# Patient Record
Sex: Male | Born: 2000 | Race: Black or African American | Hispanic: No | Marital: Single | Smoking: Never smoker
Health system: Southern US, Community
[De-identification: ages and names within clinical notes are randomized; demographics above are authoritative.]

## PROBLEM LIST (undated history)

## (undated) DIAGNOSIS — F419 Anxiety disorder, unspecified: Secondary | ICD-10-CM

## (undated) DIAGNOSIS — F319 Bipolar disorder, unspecified: Secondary | ICD-10-CM

## (undated) DIAGNOSIS — F329 Major depressive disorder, single episode, unspecified: Secondary | ICD-10-CM

## (undated) DIAGNOSIS — R45851 Suicidal ideations: Secondary | ICD-10-CM

## (undated) DIAGNOSIS — F32A Depression, unspecified: Secondary | ICD-10-CM

---
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
## 2022-07-04 ENCOUNTER — Telehealth: Payer: No Typology Code available for payment source | Admitting: Physician Assistant

## 2022-07-04 DIAGNOSIS — K047 Periapical abscess without sinus: Secondary | ICD-10-CM | POA: Diagnosis not present

## 2022-07-04 MED ORDER — AMOXICILLIN 500 MG PO CAPS: 500.0000 mg | ORAL_CAPSULE | Freq: Three times a day (TID) | ORAL | 0 refills | Status: AC

## 2022-07-04 MED ORDER — IBUPROFEN 600 MG PO TABS: 600.0000 mg | ORAL_TABLET | Freq: Three times a day (TID) | ORAL | 0 refills | Status: AC | PRN

## 2022-07-04 NOTE — Progress Notes (Signed)
Virtual Visit Consent   Andres Wood, you are scheduled for a virtual visit with a [facility] provider today. Just as with appointments in the office, your consent must be obtained to participate. Your consent will be active for this visit and any virtual visit you may have with one of our providers in the next 365 days. If you have a MyChart account, a copy of this consent can be sent to you electronically.  As this is a virtual visit, video technology does not allow for your provider to perform a traditional examination. This may limit your provider's ability to fully assess your condition. If your provider identifies any concerns that need to be evaluated in person or the need to arrange testing (such as labs, EKG, etc.), we will make arrangements to do so. Although advances in technology are sophisticated, we cannot ensure that it will always work on either your end or our end. If the connection with a video visit is poor, the visit may have to be switched to a telephone visit. With either a video or telephone visit, we are not always able to ensure that we have a secure connection.  By engaging in this virtual visit, you consent to the provision of healthcare and authorize for your insurance to be billed (if applicable) for the services provided during this visit. Depending on your insurance coverage, you may receive a charge related to this service.  I need to obtain your verbal consent now. Are you willing to proceed with your visit today? Andres Wood has provided verbal consent on 07/04/2022 for a virtual visit (video or telephone). Mar Daring, PA-C  Date: 07/04/2022 9:21 AM  Virtual Visit via Video Note   I, Mar Daring, connected with  Andres Wood  ([id number], Feb 01, 2001) on 07/04/22 at  9:15 AM EDT by a video-enabled telemedicine application and verified that I am speaking with the correct person using two identifiers.  Location: Patient: Virtual Visit Location Patient:  Home Provider: Virtual Visit Location Provider: Home Office   I discussed the limitations of evaluation and management by telemedicine and the availability of in person appointments. The patient expressed understanding and agreed to proceed.    History of Present Illness: Andres Wood is a 21 y.o. who identifies as a male who was assigned male at birth, and is being seen today for possible dental infection.  HPI: Dental Pain  This is a new problem. The current episode started yesterday. The problem occurs constantly. The problem has been gradually worsening. The pain is moderate. Associated symptoms include thermal sensitivity. Pertinent negatives include no difficulty swallowing, facial pain, fever or sinus pressure. He has tried NSAIDs for the symptoms. The treatment provided mild relief.      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    Allergies: No Known Allergies Medications:  Current Outpatient Medications:    amoxicillin (AMOXIL) 500 MG capsule, Take 1 capsule (500 mg total) by mouth 3 (three) times daily for 10 days., Disp: 30 capsule, Rfl: 0   ibuprofen (ADVIL) 600 MG tablet, Take 1 tablet (600 mg total) by mouth every 8 (eight) hours  as needed., Disp: 30 tablet, Rfl: 0   benzonatate (TESSALON PERLES) 100 MG capsule, Take 1 capsule (100 mg total) by mouth 3 (three) times daily as needed for cough., Disp: 20 capsule, Rfl: 0   COVID-19 At Home Antigen Test [facility] COVID-19 AG HOME TEST) KIT, Use as directed., Disp: 1 each, Rfl: 0   lidocaine (XYLOCAINE)  2 % solution, Use as directed 15 mLs in the mouth or throat as needed for mouth pain., Disp: 100 mL, Rfl: 0   mirtazapine (REMERON) 15 MG tablet, Take 1 tablet (15 mg total) by mouth at bedtime., Disp: 30 tablet, Rfl: 0   ondansetron (ZOFRAN-ODT) 8 MG disintegrating tablet, Take 1 tablet (8 mg total) by mouth every 8 (eight) hours as needed for nausea or vomiting., Disp: 20 tablet, Rfl: 0   OXcarbazepine (TRILEPTAL) 150 MG tablet, Take 1 tablet (150 mg total) by mouth 2 (two) times daily., Disp: 60 tablet, Rfl: 0   sertraline (ZOLOFT) 50 MG tablet, Take 1 tablet (50 mg total) by mouth daily., Disp: 30 tablet, Rfl: 0  Observations/Objective: Patient is well-developed, well-nourished in no acute distress.  Resting comfortably at home.  Head is normocephalic, atraumatic.  No labored breathing.  Speech is clear and coherent with logical content.  Patient is alert and oriented at baseline.    Assessment and Plan: 1. Dental infection - amoxicillin (AMOXIL) 500 MG capsule; Take 1 capsule (500 mg total) by mouth 3 (three) times daily for 10 days.  Dispense: 30 capsule; Refill: 0 - ibuprofen (ADVIL) 600 MG tablet; Take 1 tablet (600 mg total) by mouth every 8 (eight) hours as needed.  Dispense: 30 tablet; Refill: 0  - Suspected tooth infection  - Amoxicillin and ibuprofen prescribed - Can use ice on outside jaw/cheek for swelling - Can also take tylenol for pain with other medications - Discussed DenTemp putty that can be used to cover a broken tooth - Schedule a follow with a dentist as soon as possible (Can contact [facility] or [facility] [[id number]] associated with [place] if underinsured or uninsured) - Seek in person evaluation if symptoms fail to improve or if they worsen   Follow Up Instructions: I discussed the assessment and treatment plan with the patient. The patient was provided an opportunity to ask questions and all were  answered. The patient agreed with the plan and demonstrated an understanding of the instructions.  A copy of instructions were sent to the patient via MyChart unless otherwise noted below.    The patient was advised to call back or seek an in-person evaluation if the symptoms worsen or if the condition fails to improve as anticipated.  Time:  I spent 10 minutes with the patient via telehealth technology discussing the above problems/concerns.    Mar Daring, PA-C

## 2022-07-04 NOTE — Patient Instructions (Signed)
Andres Wood, thank you for joining Mar Daring, PA-C for today's virtual visit.  While this provider is not your primary care provider (PCP), if your PCP is located in our provider database this encounter information will be shared with them immediately following your visit.  Consent: (Patient) Andres Wood provided verbal consent for this virtual visit at the beginning of the encounter.  Current Medications:  Current Outpatient Medications:    amoxicillin (AMOXIL) 500 MG capsule, Take 1 capsule (500 mg total) by mouth 3 (three) times daily for 10 days., Disp: 30 capsule, Rfl: 0   ibuprofen (ADVIL) 600 MG tablet, Take 1 tablet (600 mg total) by mouth every 8 (eight) hours as needed., Disp: 30 tablet, Rfl: 0   benzonatate (TESSALON PERLES) 100 MG capsule, Take 1 capsule (100 mg total) by mouth 3 (three) times daily as needed for cough., Disp: 20 capsule, Rfl: 0   COVID-19 At Home Antigen Test [facility] COVID-19 AG HOME TEST) KIT, Use as directed., Disp: 1 each, Rfl: 0   lidocaine (XYLOCAINE) 2 % solution, Use as directed 15 mLs in the mouth or throat as needed for mouth pain., Disp: 100 mL, Rfl: 0   mirtazapine (REMERON) 15 MG tablet, Take 1 tablet (15 mg total) by mouth at bedtime., Disp: 30 tablet, Rfl: 0   ondansetron (ZOFRAN-ODT) 8 MG disintegrating tablet, Take 1 tablet (8 mg total) by mouth every 8 (eight) hours as needed for nausea or vomiting., Disp: 20 tablet, Rfl: 0   OXcarbazepine (TRILEPTAL) 150 MG tablet, Take 1 tablet (150 mg total) by mouth 2 (two) times daily., Disp: 60 tablet, Rfl: 0   sertraline (ZOLOFT) 50 MG tablet, Take 1 tablet (50 mg total) by mouth daily., Disp: 30 tablet, Rfl: 0   Medications ordered in this encounter:  Meds ordered this encounter  Medications   amoxicillin (AMOXIL) 500 MG capsule    Sig: Take 1 capsule (500 mg total) by mouth 3 (three) times daily for 10 days.    Dispense:  30 capsule    Refill:  0    Order Specific Question:   Supervising  Provider    Answer:   MILLER, BRIAN [[id number]]   ibuprofen (ADVIL) 600 MG tablet    Sig: Take 1 tablet (600 mg total) by mouth every 8 (eight) hours as needed.    Dispense:  30 tablet    Refill:  0    Order Specific Question:   Supervising Provider    Answer:   Sabra Heck, BRIAN [[id number]]     *If you need refills on other medications prior to your next appointment, please contact your pharmacy*  Follow-Up: Call back or seek an in-person evaluation if the symptoms worsen or if the condition fails to improve as anticipated.  Other Instructions Dental Abscess  A dental abscess is an area of pus in or around a tooth. It comes from an infection. It can cause pain and other symptoms. Treatment will help with symptoms and prevent the infection from spreading. What are the causes? This condition is caused by an infection in or around the tooth. This can be from: Very bad tooth decay (cavities). A bad injury to the tooth, such as a broken or chipped tooth. What increases the risk? The risk to get an abscess is higher in males. It is also more likely in people who: Have dental decay. Have very bad gum disease. Eat sugary snacks between meals. Use tobacco. Have diabetes. Have a weak disease-fighting system (immune system). Do not  brush their teeth regularly. What are the signs or symptoms? Some mild symptoms are: Tenderness. Bad breath. Fever. A sharp, sour taste in the mouth. Pain in and around the infected tooth. Worse symptoms of this condition include: Swollen neck glands. Chills. Pus draining around the tooth. Swelling and redness around the tooth, the mouth, or the face. Very bad pain in and around the tooth. The worst symptoms can include: Difficulty swallowing. Difficulty opening your mouth. Feeling like you may vomit or vomiting. How is this treated? This is treated by getting rid of the infection. Your dentist will discuss ways to do this, including: Antibiotic  medicines. Antibacterial mouth rinse. An incision in the abscess to drain out the pus. A root canal. Removing the tooth. Follow these instructions at home: Medicines Take over-the-counter and prescription medicines only as told by your dentist. If you were prescribed an antibiotic medicine, take it as told by your dentist. Do not stop taking it even if you start to feel better. If you were prescribed a gel that has numbing medicine in it, use it exactly as told. Ask your dentist if you should avoid driving or using machines while you are taking your medicine. General instructions Rinse your mouth often with salt water. To make salt water, dissolve -1 tsp (3-6 g) of salt in 1 cup (237 mL) of warm water. Eat a soft diet while your mouth is healing. Drink enough fluid to keep your pee (urine) pale yellow. Do not apply heat to the outside of your mouth. Do not smoke or use any products that contain nicotine or tobacco. If you need help quitting, ask your dentist. Keep all follow-up visits. Prevent an abscess Brush your teeth every morning and every night. Use fluoride toothpaste. Floss your teeth each day. Get dental cleanings as often as told by your dentist. Think about getting dental sealant put on teeth that have deep holes (decay). Drink water that has fluoride in it. Most tap water has fluoride. Check the label on bottled water to see if it has fluoride in it. Drink water instead of sugary drinks. Eat healthy meals and snacks. Wear a mouth guard or face shield when you play sports. Contact a doctor if: Your pain is worse and medicine does not help. Get help right away if: You have a fever or chills. Your symptoms suddenly get worse. You have a very bad headache. You have problems breathing or swallowing. You have trouble opening your mouth. You have swelling in your neck or close to your eye. These symptoms may be an emergency. Get help right away. Call your local emergency  services (911 in the [place] Do not wait to see if the symptoms will go away. Do not drive yourself to the hospital. Summary A dental abscess is an area of pus in or around a tooth. It is caused by an infection. Treatment will help with symptoms and prevent the infection from spreading. Take over-the-counter and prescription medicines only as told by your dentist. To prevent an abscess, take good care of your teeth. Brush your teeth every morning and night. Use floss every day. Get dental cleanings as often as told by your dentist. This information is not intended to replace advice given to you by your health care provider. Make sure you discuss any questions you have with your health care provider. Document Revised: 01/17/2021 Document Reviewed: 01/17/2021 Elsevier Patient Education  [place].    If you have been instructed to have an in-person evaluation  today at a local Urgent Care facility, please use the link below. It will take you to a list of all of our available [facility], including address, phone number and hours of operation. Please do not delay care.  [facility]  If you or a family member do not have a primary care provider, use the link below to schedule a visit and establish care. When you choose a [facility] primary care physician or advanced practice provider, you gain a long-term partner in health. Find a Primary Care Provider  Learn more about [facility]'s in-office and virtual care options: [facility]

## 2022-10-15 ENCOUNTER — Emergency Department (HOSPITAL_BASED_OUTPATIENT_CLINIC_OR_DEPARTMENT_OTHER)
Admission: EM | Admit: 2022-10-15 | Discharge: 2022-10-15 | Discharge status: Against Medical Advice | Payer: No Typology Code available for payment source | Attending: Emergency Medicine | Admitting: Emergency Medicine

## 2022-10-15 ENCOUNTER — Emergency Department (HOSPITAL_BASED_OUTPATIENT_CLINIC_OR_DEPARTMENT_OTHER): Payer: No Typology Code available for payment source | Admitting: Radiology

## 2022-10-15 ENCOUNTER — Encounter (HOSPITAL_BASED_OUTPATIENT_CLINIC_OR_DEPARTMENT_OTHER): Payer: Self-pay

## 2022-10-15 DIAGNOSIS — M62838 Other muscle spasm: Secondary | ICD-10-CM | POA: Diagnosis not present

## 2022-10-15 DIAGNOSIS — M79605 Pain in left leg: Secondary | ICD-10-CM | POA: Diagnosis present

## 2022-10-15 NOTE — ED Triage Notes (Signed)
Pt c/o cramping pain in L thigh, noticed "a lump" on lateral L thigh immediately after.. ACL surgery last Friday, no issues w surgery/ healing until today. Ambulatory w crutches

## 2022-10-15 NOTE — Discharge Instructions (Addendum)
You develop new or worsening and or swelling in your leg, leg swelling in the calf or chest pain, shortness of breath, or any other new/concerning symptoms or return to the ER for evaluation.

## 2022-10-15 NOTE — ED Provider Notes (Signed)
[facility] EMERGENCY DEPT Provider Note   CSN: [id number] Arrival date & time: 10/15/22  1800     History  Chief Complaint  Patient presents with   Leg Pain    L    Andres Wood is a 21 y.o. male.  HPI 21 year old male presents with left lateral muscle cramping and discomfort.  He recently had ACL surgery and has been doing home exercises.  He states that today he noticed a lump to the mid left thigh.  It is mildly uncomfortable.  No calf swelling, shortness of breath, or severe pain.  No numbness or weakness.  Home Medications Prior to Admission medications   Medication Sig Start Date End Date Taking? Authorizing Provider  benzonatate (TESSALON PERLES) 100 MG capsule Take 1 capsule (100 mg total) by mouth 3 (three) times daily as needed for cough. 10/30/21   Chevis Pretty, FNP  COVID-19 At Home Antigen Test [facility] COVID-19 AG HOME TEST) KIT Use as directed. 10/28/21   Mar Daring, PA-C  ibuprofen (ADVIL) 600 MG tablet Take 1 tablet (600 mg total) by mouth every 8 (eight) hours as needed. 07/04/22   Mar Daring, PA-C  lidocaine (XYLOCAINE) 2 % solution Use as directed 15 mLs in the mouth or throat as needed for mouth pain. 06/02/21   Pearson Forster, NP  mirtazapine (REMERON) 15 MG tablet Take 1 tablet (15 mg total) by mouth at bedtime. 01/03/21   Money, Lowry Ram, FNP  ondansetron (ZOFRAN-ODT) 8 MG disintegrating tablet Take 1 tablet (8 mg total) by mouth every 8 (eight) hours as needed for nausea or vomiting. 12/09/21   Lacretia Leigh, MD  OXcarbazepine (TRILEPTAL) 150 MG tablet Take 1 tablet (150 mg total) by mouth 2 (two) times daily. 01/03/21   Money, Lowry Ram, FNP  sertraline (ZOLOFT) 50 MG tablet Take 1 tablet (50 mg total) by mouth daily. 01/03/21   Money, Lowry Ram, FNP      Allergies    Patient has no known allergies.    Review of Systems   Review of Systems  Respiratory:  Negative for shortness of breath.   Cardiovascular:   Negative for leg swelling.  Musculoskeletal:  Positive for myalgias.    Physical Exam Updated Vital Signs BP 108/64 (BP Location: Right Arm)   Pulse (!) 107   Temp 99.8 F (37.7 C) (Oral)   Resp 16   Ht _0  (1.753 m)   Wt 88.5 kg   SpO2 99%   BMI 28.80 kg/m  Physical Exam Vitals and nursing note reviewed.  Constitutional:      Appearance: He is well-developed.  HENT:     Head: Normocephalic and atraumatic.  Cardiovascular:     Rate and Rhythm: Normal rate and regular rhythm.     Pulses:          Dorsalis pedis pulses are 2+ on the left side.  Pulmonary:     Effort: Pulmonary effort is normal.  Abdominal:     General: There is no distension.  Musculoskeletal:       Legs:     Comments: No calf/thigh swelling  Skin:    General: Skin is warm and dry.  Neurological:     Mental Status: He is alert.     ED Results / Procedures / Treatments   Labs (all labs ordered are listed, but only abnormal results are displayed) Labs Reviewed - No data to display  EKG None  Radiology DG Femur Min 2 Views Left  Result Date: 10/15/2022 CLINICAL DATA:  Left thigh pain.  Recent knee surgery. EXAM: LEFT FEMUR 2 VIEWS COMPARISON:  None Available. FINDINGS: The left hip is normally located. Surgical changes involving the left knee with some type of pat and electrodes around the knee. No acute bony findings. IMPRESSION: 1. No acute bony findings. 2. Surgical changes involving the left knee. Electronically Signed   By: Marijo Sanes M.D.   On: 10/15/2022 19:05    Procedures Procedures    Medications Ordered in ED Medications - No data to display  ED Course/ Medical Decision Making/ A&P                           Medical Decision Making Amount and/or Complexity of Data Reviewed Radiology: ordered.   X-rays from triage reviewed/interpreted by myself, no bony abnormalities.  I discussed potentially doing ultrasound though the location of the discomfort and the palpable lump  make it much more likely this is a muscular etiology.  After discussion, patient does not want to do the DVT ultrasound.  I think DVT is unlikely.  No shortness of breath.  However he will be given return precautions and should follow-up with his orthopedist.        Final Clinical Impression(s) / ED Diagnoses Final diagnoses:  Muscle spasm    Rx / DC Orders ED Discharge Orders     None         Sherwood Gambler, MD 10/15/22 2053

## 2023-06-30 ENCOUNTER — Telehealth: Payer: No Typology Code available for payment source | Admitting: Physician Assistant

## 2023-06-30 DIAGNOSIS — J4521 Mild intermittent asthma with (acute) exacerbation: Secondary | ICD-10-CM

## 2023-07-01 MED ORDER — ALBUTEROL SULFATE HFA 108 (90 BASE) MCG/ACT IN AERS: 2.0000 | INHALATION_SPRAY | Freq: Four times a day (QID) | RESPIRATORY_TRACT | 0 refills | Status: AC | PRN

## 2023-07-01 NOTE — Progress Notes (Signed)
I have spent 5 minutes in review of e-visit questionnaire, review and updating patient chart, medical decision making and response to patient.   Marely Apgar Cody Maximo Spratling, PA-C    

## 2023-07-01 NOTE — Progress Notes (Signed)

## 2023-11-25 ENCOUNTER — Other Ambulatory Visit: Payer: Self-pay

## 2023-11-25 ENCOUNTER — Emergency Department (HOSPITAL_COMMUNITY)
Admission: EM | Admit: 2023-11-25 | Discharge: 2023-11-25 | Disposition: A | Discharge status: Home / Self Care | Payer: 59 | Attending: Emergency Medicine | Admitting: Emergency Medicine

## 2023-11-25 DIAGNOSIS — E162 Hypoglycemia, unspecified: Secondary | ICD-10-CM | POA: Insufficient documentation

## 2023-11-25 LAB — COMPREHENSIVE METABOLIC PANEL
ALT: 307 U/L — ABNORMAL HIGH (ref 0–44)
AST: 587 U/L — ABNORMAL HIGH (ref 15–41)
Albumin: 5.2 g/dL — ABNORMAL HIGH (ref 3.5–5.0)
Alkaline Phosphatase: 83 U/L (ref 38–126)
Anion gap: 20 — ABNORMAL HIGH (ref 5–15)
BUN: 19 mg/dL (ref 6–20)
CO2: 18 mmol/L — ABNORMAL LOW (ref 22–32)
Calcium: 10 mg/dL (ref 8.9–10.3)
Chloride: 103 mmol/L (ref 98–111)
Creatinine, Ser: 1.23 mg/dL (ref 0.61–1.24)
GFR, Estimated: >60 mL/min (ref >60)
Glucose, Bld: 42 mg/dL — CL (ref 70–99)
Potassium: 4.1 mmol/L (ref 3.5–5.1)
Sodium: 141 mmol/L (ref 135–145)
Total Bilirubin: 2 mg/dL — ABNORMAL HIGH (ref 0.0–1.2)
Total Protein: 8.5 g/dL — ABNORMAL HIGH (ref 6.5–8.1)

## 2023-11-25 LAB — CBC
HCT: 53.1 % — ABNORMAL HIGH (ref 39.0–52.0)
Hemoglobin: 16.6 g/dL (ref 13.0–17.0)
MCH: 27.7 pg (ref 26.0–34.0)
MCHC: 31.3 g/dL (ref 30.0–36.0)
MCV: 88.6 fL (ref 80.0–100.0)
Platelets: 255 10*3/uL (ref 150–400)
RBC: 5.99 MIL/uL — ABNORMAL HIGH (ref 4.22–5.81)
RDW: 14.3 % (ref 11.5–15.5)
WBC: 20 10*3/uL — ABNORMAL HIGH (ref 4.0–10.5)
nRBC: 0 % (ref 0.0–0.2)

## 2023-11-25 LAB — ETHANOL: Alcohol, Ethyl (B): 32 mg/dL — ABNORMAL HIGH (ref <10)

## 2023-11-25 LAB — CBG MONITORING, ED
Glucose-Capillary: 44 mg/dL — CL (ref 70–99)
Glucose-Capillary: 85 mg/dL (ref 70–99)

## 2023-11-25 MED ORDER — DEXTROSE 50 % IV SOLN
INTRAVENOUS | Status: AC
  Filled 2023-11-25: qty 50

## 2023-11-25 NOTE — ED Notes (Signed)
 Patient discharged by this RN. Patient provided orange juice and snacks by this RN. Patient leaving with mother, ambulatory to lobby.

## 2023-11-25 NOTE — ED Provider Notes (Signed)
 [place] EMERGENCY DEPARTMENT AT [facility] Provider Note   CSN: [id number] Arrival date & time: 11/25/23  1314     History  No chief complaint on file.   Andres Wood is a 23 y.o. male.  23 year old male presents to the ED with his mother.  Patient reports that last night he was at a [employer] party.  He drank at least a half a bottle of Hennessy with his friends.  This morning he began to feel poorly with nausea, heavy sweating, mild confusion.  On arrival to the ED he was noted to be hypoglycemic.  He is reporting that he did not have much in the way of p.o. food since yesterday afternoon despite a long night of heavy drinking.  Patient given dextrose  in triage.  At the time of my evaluation he feels significantly improved.  He denies current symptoms.  He reports that he does not drink heavily like he did last night on a regular basis.  The history is provided by the patient and medical records.       Home Medications Prior to Admission medications   Medication Sig Start Date End Date Taking? Authorizing Provider  albuterol  (VENTOLIN  HFA) 108 (90 Base) MCG/ACT inhaler Inhale 2 puffs into the lungs every 6 (six) hours as needed for wheezing or shortness of breath. 07/01/23   Gladis Elsie BROCKS, PA-C  benzonatate  (TESSALON  PERLES) 100 MG capsule Take 1 capsule (100 mg total) by mouth 3 (three) times daily as needed for cough. 10/30/21   Gladis Mustard, FNP  COVID-19 At Home Antigen Test [facility] COVID-19 AG HOME TEST) KIT Use as directed. 10/28/21   Burnette, Jennifer M, PA-C  ibuprofen  (ADVIL ) 600 MG tablet Take 1 tablet (600 mg total) by mouth every 8 (eight) hours as needed. 07/04/22   Vivienne Delon HERO, PA-C  lidocaine  (XYLOCAINE ) 2 % solution Use as directed 15 mLs in the mouth or throat as needed for mouth pain. 06/02/21   Claudene Ashley SAUNDERS, NP  mirtazapine  (REMERON ) 15 MG tablet Take 1 tablet (15 mg total) by mouth at bedtime. 01/03/21   Money, Caron NOVAK,  FNP  ondansetron  (ZOFRAN -ODT) 8 MG disintegrating tablet Take 1 tablet (8 mg total) by mouth every 8 (eight) hours as needed for nausea or vomiting. 12/09/21   Dasie Faden, MD  OXcarbazepine  (TRILEPTAL ) 150 MG tablet Take 1 tablet (150 mg total) by mouth 2 (two) times daily. 01/03/21   Money, Caron NOVAK, FNP  sertraline  (ZOLOFT ) 50 MG tablet Take 1 tablet (50 mg total) by mouth daily. 01/03/21   Money, Caron NOVAK, FNP      Allergies    Patient has no known allergies.    Review of Systems   Review of Systems  All other systems reviewed and are negative.   Physical Exam Updated Vital Signs BP (!) 142/80   Pulse (!) 53   Temp (!) 97.5 F (36.4 C) (Oral)   Resp 16   SpO2 100%  Physical Exam Vitals and nursing note reviewed.  Constitutional:      General: He is not in acute distress.    Appearance: Normal appearance. He is well-developed.  HENT:     Head: Normocephalic and atraumatic.  Eyes:     Conjunctiva/sclera: Conjunctivae normal.     Pupils: Pupils are equal, round, and reactive to light.  Cardiovascular:     Rate and Rhythm: Normal rate and regular rhythm.     Heart sounds: Normal heart sounds.  Pulmonary:  Effort: Pulmonary effort is normal. No respiratory distress.     Breath sounds: Normal breath sounds.  Abdominal:     General: There is no distension.     Palpations: Abdomen is soft.     Tenderness: There is no abdominal tenderness.  Musculoskeletal:        General: No deformity. Normal range of motion.     Cervical back: Normal range of motion and neck supple.  Skin:    General: Skin is warm and dry.  Neurological:     General: No focal deficit present.     Mental Status: He is alert and oriented to person, place, and time.     ED Results / Procedures / Treatments   Labs (all labs ordered are listed, but only abnormal results are displayed) Labs Reviewed  COMPREHENSIVE METABOLIC PANEL - Abnormal; Notable for the following components:      Result Value    CO2 18 (*)    Glucose, Bld 42 (*)    Total Protein 8.5 (*)    Albumin 5.2 (*)    AST 587 (*)    ALT 307 (*)    Total Bilirubin 2.0 (*)    Anion gap 20 (*)    All other components within normal limits  CBC - Abnormal; Notable for the following components:   WBC 20.0 (*)    RBC 5.99 (*)    HCT 53.1 (*)    All other components within normal limits  ETHANOL - Abnormal; Notable for the following components:   Alcohol, Ethyl (B) 32 (*)    All other components within normal limits  CBG MONITORING, ED - Abnormal; Notable for the following components:   Glucose-Capillary 44 (*)    All other components within normal limits  CBG MONITORING, ED    EKG None  Radiology No results found.  Procedures Procedures    Medications Ordered in ED Medications  dextrose  50 % solution (  Not Given 11/25/23 1552)    ED Course/ Medical Decision Making/ A&P                                 Medical Decision Making Amount and/or Complexity of Data Reviewed Labs: ordered.    Medical Screen Complete  This patient presented to the ED with complaint of alcohol intoxication, alcohol binge drinking, hypoglycemia.  This complaint involves an extensive number of treatment options. The initial differential diagnosis includes, but is not limited to, metabolic abnormality, alcohol intoxication, hypoglycemia, etc.  This presentation is: Acute, Chronic, Self-Limited, Previously Undiagnosed, Uncertain Prognosis, Complicated, Systemic Symptoms, and Threat to Life/Bodily Function  Patient reports heavy alcohol consumption overnight last night.  This was associated with minimal food intake for the last 12 to 18 hours.  On arrival to the ED the patient was noted to be hypoglycemic.  His alcohol level was 32 despite it being several hours since his last reported alcohol consumption.  Patient was given p.o. dextrose  in triage.  At the time of my evaluation he feels significantly improved.  He is  asymptomatic.  He is advised to abstain from excessive alcohol consumption in the future.  He and his mother at bedside understand need to avoid excessive alcohol consumption.  Labs reflect likely stress response related to heavy alcohol consumption over the last 24 hours.  Strict return precautions given and understood.  Importance of close follow-up is stressed.    Additional history obtained:  Additional history obtained from [facility] External records from outside sources obtained and reviewed including prior ED visits and prior Inpatient records.    Lab Tests:  I ordered and personally interpreted labs.  The pertinent results include: CBC, CMP, EtOH, Accu-Chek  Problem List / ED Course:  Excessive alcohol consumption, hypoglycemia   Reevaluation:  After the interventions noted above, I reevaluated the patient and found that they have: resolved    Disposition:  After consideration of the diagnostic results and the patients response to treatment, I feel that the patent would benefit from close outpatient follow-up.          Final Clinical Impression(s) / ED Diagnoses Final diagnoses:  Hypoglycemia    Rx / DC Orders ED Discharge Orders     None         Laurice Maude BROCKS, MD 11/25/23 1558

## 2023-11-25 NOTE — Discharge Instructions (Addendum)
 Return for any problem.  Abstain from excessive alcohol use and consumption.  Your evaluation today suggested significant alcohol intoxication over the last 24 hours.  Notably, your liver enzymes were mildly elevated.  This is no doubt secondary to the excessive alcohol that you consumed over the last 24 hours.  It is important to abstain from alcohol use in the future.  Establishment with a primary care provider for outpatient follow-up is important.  At some point in the next 2 to 3 weeks, you should have a recheck of your liver function test.  If you can abstain from alcohol use your liver function tests should return to normal.

## 2023-11-25 NOTE — ED Notes (Addendum)
 Pt given 8 orange juice. Awake and alert.

## 2023-11-25 NOTE — ED Triage Notes (Signed)
Patient's mother reports excessive drinking last night and this morning having profuse sweating and confusion. States he drank half a bottle of Hennesy and does not routinely drink.

## 2023-12-27 ENCOUNTER — Telehealth: Payer: No Typology Code available for payment source

## 2023-12-27 DIAGNOSIS — J069 Acute upper respiratory infection, unspecified: Secondary | ICD-10-CM | POA: Diagnosis not present

## 2023-12-28 MED ORDER — FLUTICASONE PROPIONATE 50 MCG/ACT NA SUSP: 2.0000 | Freq: Every day | NASAL | 0 refills | Status: AC

## 2023-12-28 MED ORDER — BENZONATATE 100 MG PO CAPS: 100.0000 mg | ORAL_CAPSULE | Freq: Three times a day (TID) | ORAL | 0 refills | Status: AC | PRN

## 2023-12-28 NOTE — Progress Notes (Signed)

## 2024-04-02 ENCOUNTER — Telehealth: Admitting: Family Medicine

## 2024-04-02 DIAGNOSIS — K0889 Other specified disorders of teeth and supporting structures: Secondary | ICD-10-CM | POA: Diagnosis not present

## 2024-04-02 MED ORDER — AMOXICILLIN-POT CLAVULANATE 875-125 MG PO TABS: 1.0000 | ORAL_TABLET | Freq: Two times a day (BID) | ORAL | 0 refills | Status: AC

## 2024-04-02 NOTE — Progress Notes (Signed)
 E-Visit for Dental Pain  We are sorry that you are not feeling well.  Here is how we plan to help!  Based on what you have shared with me in the questionnaire, it sounds like you have tooth infection.   Augmentin  875-125mg  twice a day for 7 days  It is imperative that you see a dentist within 10 days of this eVisit to determine the cause of the dental pain and be sure it is adequately treated  A toothache or tooth pain is caused when the nerve in the root of a tooth or surrounding a tooth is irritated. Dental (tooth) infection, decay, injury, or loss of a tooth are the most common causes of dental pain. Pain may also occur after an extraction (tooth is pulled out). Pain sometimes originates from other areas and radiates to the jaw, thus appearing to be tooth pain.Bacteria growing inside your mouth can contribute to gum disease and dental decay, both of which can cause pain. A toothache occurs from inflammation of the central portion of the tooth called pulp. The pulp contains nerve endings that are very sensitive to pain. Inflammation to the pulp or pulpitis may be caused by dental cavities, trauma, and infection.    HOME CARE:   For toothaches: Over-the-counter pain medications such as acetaminophen  or ibuprofen  may be used. Take these as directed on the package while you arrange for a dental appointment. Avoid very cold or hot foods, because they may make the pain worse. You may get relief from biting on a cotton ball soaked in oil of cloves. You can get oil of cloves at most drug stores.  For jaw pain:  Aspirin may be helpful for problems in the joint of the jaw in adults. If pain happens every time you open your mouth widely, the temporomandibular joint (TMJ) may be the source of the pain. Yawning or taking a large bite of food may worsen the pain. An appointment with your doctor or dentist will help you find the cause.     GET HELP RIGHT AWAY IF:  You have a high fever or chills If  you have had a recent head or face injury and develop headache, light headedness, nausea, vomiting, or other symptoms that concern you after an injury to your face or mouth, you could have a more serious injury in addition to your dental injury. A facial rash associated with a toothache: This condition may improve with medication. Contact your doctor for them to decide what is appropriate. Any jaw pain occurring with chest pain: Although jaw pain is most commonly caused by dental disease, it is sometimes referred pain from other areas. People with heart disease, especially people who have had stents placed, people with diabetes, or those who have had heart surgery may have jaw pain as a symptom of heart attack or angina. If your jaw or tooth pain is associated with lightheadedness, sweating, or shortness of breath, you should see a doctor as soon as possible. Trouble swallowing or excessive pain or bleeding from gums: If you have a history of a weakened immune system, diabetes, or steroid use, you may be more susceptible to infections. Infections can often be more severe and extensive or caused by unusual organisms. Dental and gum infections in people with these conditions may require more aggressive treatment. An abscess may need draining or IV antibiotics, for example.  MAKE SURE YOU   Understand these instructions. Will watch your condition. Will get help right away if you are  not doing well or get worse.  Thank you for choosing an e-visit.  Your e-visit answers were reviewed by a board certified advanced clinical practitioner to complete your personal care plan. Depending upon the condition, your plan could have included both over the counter or prescription medications.  Please review your pharmacy choice. Make sure the pharmacy is open so you can pick up prescription now. If there is a problem, you may contact your provider through [employer] and have the prescription routed to another  pharmacy.  Your safety is important to us . If you have drug allergies check your prescription carefully.   For the next 24 hours you can use MyChart to ask questions about today's visit, request a non-urgent call back, or ask for a work or school excuse. You will get an email in the next two days asking about your experience. I hope that your e-visit has been valuable and will speed your recovery.  I have spent 5 minutes in review of e-visit questionnaire, review and updating patient chart, medical decision making and response to patient.   Louvenia Roys, PA-C

## 2024-04-15 ENCOUNTER — Ambulatory Visit: Admitting: Family Medicine

## 2024-04-25 ENCOUNTER — Ambulatory Visit: Admitting: Family Medicine

## 2024-06-23 ENCOUNTER — Ambulatory Visit (HOSPITAL_COMMUNITY): Payer: Self-pay

## 2024-06-24 ENCOUNTER — Ambulatory Visit (HOSPITAL_COMMUNITY): Payer: Self-pay

## 2024-08-19 ENCOUNTER — Telehealth: Admitting: Family Medicine

## 2024-08-19 DIAGNOSIS — K0889 Other specified disorders of teeth and supporting structures: Secondary | ICD-10-CM

## 2024-08-20 MED ORDER — CLINDAMYCIN HCL 300 MG PO CAPS: 300.0000 mg | ORAL_CAPSULE | Freq: Three times a day (TID) | ORAL | 0 refills | Status: AC

## 2024-08-20 NOTE — Progress Notes (Signed)
 E-Visit for Dental Pain  We are sorry that you are not feeling well.  Here is how we plan to help!  Based on what you have shared with me in the questionnaire, it sounds like you have dental infection.  Clindamycin 300mg  3 times a day for 7 days  It is imperative that you see a dentist within 10 days of this eVisit to determine the cause of the dental pain and be sure it is adequately treated  A toothache or tooth pain is caused when the nerve in the root of a tooth or surrounding a tooth is irritated. Dental (tooth) infection, decay, injury, or loss of a tooth are the most common causes of dental pain. Pain may also occur after an extraction (tooth is pulled out). Pain sometimes originates from other areas and radiates to the jaw, thus appearing to be tooth pain.Bacteria growing inside your mouth can contribute to gum disease and dental decay, both of which can cause pain. A toothache occurs from inflammation of the central portion of the tooth called pulp. The pulp contains nerve endings that are very sensitive to pain. Inflammation to the pulp or pulpitis may be caused by dental cavities, trauma, and infection.    HOME CARE:   For toothaches: Over-the-counter pain medications such as acetaminophen  or ibuprofen  may be used. Take these as directed on the package while you arrange for a dental appointment. Avoid very cold or hot foods, because they may make the pain worse. You may get relief from biting on a cotton ball soaked in oil of cloves. You can get oil of cloves at most drug stores.  For jaw pain:  Aspirin may be helpful for problems in the joint of the jaw in adults. If pain happens every time you open your mouth widely, the temporomandibular joint (TMJ) may be the source of the pain. Yawning or taking a large bite of food may worsen the pain. An appointment with your doctor or dentist will help you find the cause.     GET HELP RIGHT AWAY IF:  You have a high fever or chills If  you have had a recent head or face injury and develop headache, light headedness, nausea, vomiting, or other symptoms that concern you after an injury to your face or mouth, you could have a more serious injury in addition to your dental injury. A facial rash associated with a toothache: This condition may improve with medication. Contact your doctor for them to decide what is appropriate. Any jaw pain occurring with chest pain: Although jaw pain is most commonly caused by dental disease, it is sometimes referred pain from other areas. People with heart disease, especially people who have had stents placed, people with diabetes, or those who have had heart surgery may have jaw pain as a symptom of heart attack or angina. If your jaw or tooth pain is associated with lightheadedness, sweating, or shortness of breath, you should see a doctor as soon as possible. Trouble swallowing or excessive pain or bleeding from gums: If you have a history of a weakened immune system, diabetes, or steroid use, you may be more susceptible to infections. Infections can often be more severe and extensive or caused by unusual organisms. Dental and gum infections in people with these conditions may require more aggressive treatment. An abscess may need draining or IV antibiotics, for example.  MAKE SURE YOU   Understand these instructions. Will watch your condition. Will get help right away if you are  not doing well or get worse.  Thank you for choosing an e-visit.  Your e-visit answers were reviewed by a board certified advanced clinical practitioner to complete your personal care plan. Depending upon the condition, your plan could have included both over the counter or prescription medications.  Please review your pharmacy choice. Make sure the pharmacy is open so you can pick up prescription now. If there is a problem, you may contact your provider through [employer] and have the prescription routed to another  pharmacy.  Your safety is important to us . If you have drug allergies check your prescription carefully.   For the next 24 hours you can use MyChart to ask questions about today's visit, request a non-urgent call back, or ask for a work or school excuse. You will get an email in the next two days asking about your experience. I hope that your e-visit has been valuable and will speed your recovery.  I have spent 5 minutes in review of e-visit questionnaire, review and updating patient chart, medical decision making and response to patient.   Roosvelt Mater, PA-C

## 2024-09-22 ENCOUNTER — Ambulatory Visit (HOSPITAL_BASED_OUTPATIENT_CLINIC_OR_DEPARTMENT_OTHER): Admitting: Family Medicine

## 2024-10-06 ENCOUNTER — Ambulatory Visit: Admitting: Family Medicine

## 2024-10-28 ENCOUNTER — Telehealth: Admitting: Family Medicine

## 2024-10-28 DIAGNOSIS — K0889 Other specified disorders of teeth and supporting structures: Secondary | ICD-10-CM

## 2024-10-28 MED ORDER — PENICILLIN V POTASSIUM 500 MG PO TABS: 500.0000 mg | ORAL_TABLET | Freq: Three times a day (TID) | ORAL | 0 refills | Status: AC

## 2024-10-28 NOTE — Progress Notes (Signed)
 E-Visit for Dental Pain  We are sorry that you are not feeling well.  Here is how we plan to help!  Based on what you have shared with me in the questionnaire, it sounds like you have dental infection.  Pen VK 500mg  3 times a day for 7 days  It is imperative that you see a dentist within 10 days of this eVisit to determine the cause of the dental pain and be sure it is adequately treated  A toothache or tooth pain is caused when the nerve in the root of a tooth or surrounding a tooth is irritated. Dental (tooth) infection, decay, injury, or loss of a tooth are the most common causes of dental pain. Pain may also occur after an extraction (tooth is pulled out). Pain sometimes originates from other areas and radiates to the jaw, thus appearing to be tooth pain.Bacteria growing inside your mouth can contribute to gum disease and dental decay, both of which can cause pain. A toothache occurs from inflammation of the central portion of the tooth called pulp. The pulp contains nerve endings that are very sensitive to pain. Inflammation to the pulp or pulpitis may be caused by dental cavities, trauma, and infection.    HOME CARE:   For toothaches: Over-the-counter pain medications such as acetaminophen  or ibuprofen  may be used. Take these as directed on the package while you arrange for a dental appointment. Avoid very cold or hot foods, because they may make the pain worse. You may get relief from biting on a cotton ball soaked in oil of cloves. You can get oil of cloves at most drug stores.  For jaw pain:  Aspirin may be helpful for problems in the joint of the jaw in adults. If pain happens every time you open your mouth widely, the temporomandibular joint (TMJ) may be the source of the pain. Yawning or taking a large bite of food may worsen the pain. An appointment with your doctor or dentist will help you find the cause.     GET HELP RIGHT AWAY IF:  You have a high fever or chills If you  have had a recent head or face injury and develop headache, light headedness, nausea, vomiting, or other symptoms that concern you after an injury to your face or mouth, you could have a more serious injury in addition to your dental injury. A facial rash associated with a toothache: This condition may improve with medication. Contact your doctor for them to decide what is appropriate. Any jaw pain occurring with chest pain: Although jaw pain is most commonly caused by dental disease, it is sometimes referred pain from other areas. People with heart disease, especially people who have had stents placed, people with diabetes, or those who have had heart surgery may have jaw pain as a symptom of heart attack or angina. If your jaw or tooth pain is associated with lightheadedness, sweating, or shortness of breath, you should see a doctor as soon as possible. Trouble swallowing or excessive pain or bleeding from gums: If you have a history of a weakened immune system, diabetes, or steroid use, you may be more susceptible to infections. Infections can often be more severe and extensive or caused by unusual organisms. Dental and gum infections in people with these conditions may require more aggressive treatment. An abscess may need draining or IV antibiotics, for example.  MAKE SURE YOU   Understand these instructions. Will watch your condition. Will get help right away if you  are not doing well or get worse.  Thank you for choosing an e-visit.  Your e-visit answers were reviewed by a board certified advanced clinical practitioner to complete your personal care plan. Depending upon the condition, your plan could have included both over the counter or prescription medications.  Please review your pharmacy choice. Make sure the pharmacy is open so you can pick up prescription now. If there is a problem, you may contact your provider through [employer] and have the prescription routed to another  pharmacy.  Your safety is important to us . If you have drug allergies check your prescription carefully.   For the next 24 hours you can use MyChart to ask questions about today's visit, request a non-urgent call back, or ask for a work or school excuse. You will get an email in the next two days asking about your experience. I hope that your e-visit has been valuable and will speed your recovery.  I have spent 5 minutes in review of e-visit questionnaire, review and updating patient chart, medical decision making and response to patient.   Vyolet Sakuma, FNP
